# Patient Record
Sex: Female | Born: 1965 | Race: Black or African American | Hispanic: No | Marital: Single | Smoking: Current every day smoker
Health system: Southern US, Community
[De-identification: ages and names within clinical notes are randomized; demographics above are authoritative.]

## PROBLEM LIST (undated history)

## (undated) HISTORY — PX: TUBAL LIGATION: SHX77

---

## 2007-12-31 ENCOUNTER — Ambulatory Visit: Payer: Self-pay | Admitting: Obstetrics & Gynecology

## 2007-12-31 ENCOUNTER — Other Ambulatory Visit: Admission: RE | Admit: 2007-12-31 | Discharge: 2007-12-31 | Payer: Self-pay | Admitting: Obstetrics and Gynecology

## 2008-01-13 ENCOUNTER — Ambulatory Visit: Payer: Self-pay | Admitting: Obstetrics and Gynecology

## 2011-06-22 ENCOUNTER — Encounter (HOSPITAL_BASED_OUTPATIENT_CLINIC_OR_DEPARTMENT_OTHER): Payer: Self-pay | Admitting: *Deleted

## 2011-06-22 ENCOUNTER — Emergency Department (HOSPITAL_BASED_OUTPATIENT_CLINIC_OR_DEPARTMENT_OTHER)
Admission: EM | Admit: 2011-06-22 | Discharge: 2011-06-22 | Disposition: A | Discharge status: Home / Self Care | Payer: Self-pay | Attending: Emergency Medicine | Admitting: Emergency Medicine

## 2011-06-22 DIAGNOSIS — M5430 Sciatica, unspecified side: Secondary | ICD-10-CM | POA: Insufficient documentation

## 2011-06-22 DIAGNOSIS — M549 Dorsalgia, unspecified: Secondary | ICD-10-CM | POA: Insufficient documentation

## 2011-06-22 DIAGNOSIS — R209 Unspecified disturbances of skin sensation: Secondary | ICD-10-CM | POA: Insufficient documentation

## 2011-06-22 LAB — URINALYSIS, ROUTINE W REFLEX MICROSCOPIC
Specific Gravity, Urine: 1.006 (ref 1.005–1.030)
Urobilinogen, UA: 1 mg/dL (ref 0.0–1.0)
pH: 7 (ref 5.0–8.0)

## 2011-06-22 LAB — PREGNANCY, URINE: Preg Test, Ur: NEGATIVE

## 2011-06-22 LAB — URINE MICROSCOPIC-ADD ON

## 2011-06-22 MED ORDER — IBUPROFEN 600 MG PO TABS: 600.0000 mg | ORAL_TABLET | Freq: Four times a day (QID) | ORAL | Status: AC | PRN

## 2011-06-22 MED ORDER — OXYCODONE-ACETAMINOPHEN 5-325 MG PO TABS
1.0000 | ORAL_TABLET | Freq: Once | ORAL | Status: AC
  Administered 2011-06-22: 1 via ORAL
  Filled 2011-06-22: qty 1

## 2011-06-22 MED ORDER — OXYCODONE-ACETAMINOPHEN 7.5-325 MG PO TABS: 1.0000 | ORAL_TABLET | ORAL | Status: AC | PRN

## 2011-06-22 NOTE — ED Notes (Signed)
Pt states she has been hurting in her mid-lower back area since Wed. Tried Ibuprofen without relief. Denies other s/s.

## 2011-06-22 NOTE — ED Provider Notes (Signed)
History    Scribed for Nelia Shi, MD, the patient was seen in room [facility]1/[facility]. This chart was scribed by Katha Cabal.   CSN: [id number]  Arrival date & time 06/22/11  1654   First MD Initiated Contact with Patient 06/22/11 1854      Chief Complaint  Patient presents with  . Back Pain    (Consider location/radiation/quality/duration/timing/severity/associated sxs/prior treatment) Patient is a 46 y.o. female presenting with back pain. The history is provided by the patient. No language interpreter was used.  Back Pain  This is a new problem. Episode onset: about 4 days ago. The problem occurs constantly. The problem has been gradually worsening. The pain is associated with no known injury. The pain is present in the lumbar spine. The quality of the pain is described as shooting. The pain radiates to the right thigh. Pain severity now: moderate to severe  The symptoms are aggravated by certain positions (sitting ). Pertinent negatives include no bowel incontinence and no bladder incontinence. She has tried NSAIDs for the symptoms.    Patient reports shooting pain that radiates down her leg.  Pain worsened by certain position.  Patient taking 800 mg ibuprofen for pain.  Patient denies history of back or nerve problems.  Pain associated with sleep disturbance.    History reviewed. No pertinent past medical history.  Past Surgical History  Procedure Date  . Tubal ligation     History reviewed. No pertinent family history.  History  Substance Use Topics  . Smoking status: Current Everyday Smoker  . Smokeless tobacco: Not on file  . Alcohol Use: Yes    OB History    Grav Para Term Preterm Abortions TAB SAB Ect Mult Living                  Review of Systems  Gastrointestinal: Negative for bowel incontinence.  Genitourinary: Negative for bladder incontinence.  Musculoskeletal: Positive for back pain.  Remaining review of systems negative except as noted in the HPI.     Allergies  Review of patient's allergies indicates not on file.  Home Medications   Current Outpatient Rx  Name Route Sig Dispense Refill  . IBUPROFEN 600 MG PO TABS Oral Take 1 tablet (600 mg total) by mouth every 6 (six) hours as needed for pain. 30 tablet 0  . OXYCODONE-ACETAMINOPHEN 7.5-325 MG PO TABS Oral Take 1 tablet by mouth every 4 (four) hours as needed for pain. 30 tablet 0    BP 137/70  Pulse 92  Temp(Src) 97.6 F (36.4 C) (Oral)  Resp 18  Ht 5\' 5"  (1.651 m)  Wt 198 lb (89.812 kg)  BMI 32.95 kg/m2  SpO2 100%  LMP 06/22/2011  Physical Exam  Nursing note and vitals reviewed. Constitutional: She is oriented to person, place, and time. She appears well-developed and well-nourished. No distress.       Patient looks uncomfortable.  She sitting on the side of the gurney leaning towards the right because any movement causes back pain which radiates down her leg  HENT:  Head: Normocephalic and atraumatic.  Eyes: Pupils are equal, round, and reactive to light.  Neck: Normal range of motion.  Cardiovascular: Normal rate and intact distal pulses.   Pulmonary/Chest: No respiratory distress.  Abdominal: Normal appearance. She exhibits no distension.  Musculoskeletal: Normal range of motion. She exhibits tenderness.       exquisite tenderness to palpation to L1, Pain in right lumbar radiating to right buttock  Neurological: She is  alert and oriented to person, place, and time. No cranial nerve deficit.  Skin: Skin is warm and dry. No rash noted.  Psychiatric: She has a normal mood and affect. Her behavior is normal.    ED Course  Procedures (including critical care time)   DIAGNOSTIC STUDIES: Oxygen Saturation is 100% on room air, normal by my interpretation.     COORDINATION OF CARE: 7:03 PM  Physical exam complete.     LABS / RADIOLOGY:   Labs Reviewed  URINALYSIS, ROUTINE W REFLEX MICROSCOPIC - Abnormal; Notable for the following:    Hgb urine dipstick  MODERATE (*)    All other components within normal limits  PREGNANCY, URINE  URINE MICROSCOPIC-ADD ON   No results found.       MDM  I personally performed the services described in this documentation, which was scribed in my presence. The recorded information has been reviewed and considered.       IMPRESSION: 1. Sciatica       DISCHARGE MEDICATIONS: New Prescriptions   IBUPROFEN (ADVIL,MOTRIN) 600 MG TABLET    Take 1 tablet (600 mg total) by mouth every 6 (six) hours as needed for pain.   OXYCODONE-ACETAMINOPHEN (PERCOCET) 7.5-325 MG PER TABLET    Take 1 tablet by mouth every 4 (four) hours as needed for pain.    Plan:  Patient will be given pain medication with directions.  If she's not improved by Tuesday she could return for an MRI scan        Nelia Shi, MD 06/22/11 1911

## 2014-05-20 ENCOUNTER — Encounter (HOSPITAL_BASED_OUTPATIENT_CLINIC_OR_DEPARTMENT_OTHER): Payer: Self-pay

## 2014-05-20 ENCOUNTER — Emergency Department (HOSPITAL_BASED_OUTPATIENT_CLINIC_OR_DEPARTMENT_OTHER): Payer: No Typology Code available for payment source

## 2014-05-20 ENCOUNTER — Emergency Department (HOSPITAL_BASED_OUTPATIENT_CLINIC_OR_DEPARTMENT_OTHER)
Admission: EM | Admit: 2014-05-20 | Discharge: 2014-05-20 | Disposition: A | Discharge status: Home / Self Care | Payer: No Typology Code available for payment source | Attending: Emergency Medicine | Admitting: Emergency Medicine

## 2014-05-20 DIAGNOSIS — M546 Pain in thoracic spine: Secondary | ICD-10-CM | POA: Insufficient documentation

## 2014-05-20 DIAGNOSIS — Z720 Tobacco use: Secondary | ICD-10-CM | POA: Diagnosis not present

## 2014-05-20 DIAGNOSIS — M549 Dorsalgia, unspecified: Secondary | ICD-10-CM

## 2014-05-20 MED ORDER — HYDROCODONE-ACETAMINOPHEN 5-325 MG PO TABS: 2.0000 | ORAL_TABLET | ORAL | Status: AC | PRN

## 2014-05-20 MED ORDER — CYCLOBENZAPRINE HCL 10 MG PO TABS: 10.0000 mg | ORAL_TABLET | Freq: Three times a day (TID) | ORAL | Status: AC | PRN

## 2014-05-20 NOTE — ED Provider Notes (Signed)
CSN: [id number]     Arrival date & time 05/20/14  1624 History   First MD Initiated Contact with Patient 05/20/14 1720     Chief Complaint  Patient presents with  . Back Pain     (Consider location/radiation/quality/duration/timing/severity/associated sxs/prior Treatment) HPI Comments: Patient presents to the ER for evaluation of back pain. Patient reports that she has been experiencing pain in the upper portion of the right side of her back for approximately a week. She denies any injury. Patient reports that she was at the [facility] standing in line today when the pain intensified. She has not noticed any cough or shortness of breath. She denies any injury. She has noticed that the pain significantly worsens if she bends or twists.  Patient is a 49 y.o. female presenting with back pain.  Back Pain   History reviewed. No pertinent past medical history. Past Surgical History  Procedure Laterality Date  . Tubal ligation     No family history on file. History  Substance Use Topics  . Smoking status: Current Every Day Smoker  . Smokeless tobacco: Not on file  . Alcohol Use: Yes   OB History    No data available     Review of Systems  Musculoskeletal: Positive for back pain.  All other systems reviewed and are negative.     Allergies  Review of patient's allergies indicates no known allergies.  Home Medications   Prior to Admission medications   Medication Sig Start Date End Date Taking? Authorizing Provider  loratadine (CLARITIN) 10 MG tablet Take 10 mg by mouth daily.   Yes Historical Provider, MD  montelukast (SINGULAIR) 10 MG tablet Take 10 mg by mouth at bedtime.   Yes Historical Provider, MD   BP 132/73 mmHg  Pulse 67  Temp(Src) 97.7 F (36.5 C)  Resp 16  SpO2 100%  LMP  (Approximate) Physical Exam  Constitutional: She is oriented to person, place, and time. She appears well-developed and well-nourished. No distress.  HENT:  Head: Normocephalic and atraumatic.    Right Ear: Hearing normal.  Left Ear: Hearing normal.  Nose: Nose normal.  Mouth/Throat: Oropharynx is clear and moist and mucous membranes are normal.  Eyes: Conjunctivae and EOM are normal. Pupils are equal, round, and reactive to light.  Neck: Normal range of motion. Neck supple.  Cardiovascular: Regular rhythm, S1 normal and S2 normal.  Exam reveals no gallop and no friction rub.   No murmur heard. Pulmonary/Chest: Effort normal and breath sounds normal. No respiratory distress. She exhibits no tenderness.  Abdominal: Soft. Normal appearance and bowel sounds are normal. There is no hepatosplenomegaly. There is no tenderness. There is no rebound, no guarding, no tenderness at McBurney's point and negative Murphy's sign. No hernia.  Musculoskeletal: Normal range of motion.       Thoracic back: She exhibits tenderness.       Back:  Neurological: She is alert and oriented to person, place, and time. She has normal strength. No cranial nerve deficit or sensory deficit. Coordination normal. GCS eye subscore is 4. GCS verbal subscore is 5. GCS motor subscore is 6.  Skin: Skin is warm, dry and intact. No rash noted. No cyanosis.  Psychiatric: She has a normal mood and affect. Her speech is normal and behavior is normal. Thought content normal.  Nursing note and vitals reviewed.   ED Course  Procedures (including critical care time) Labs Review Labs Reviewed - No data to display  Imaging Review Dg Thoracic Spine 2  View  05/20/2014   CLINICAL DATA:  Mild back pain for 2 weeks.  Initially counter  EXAM: THORACIC SPINE - 2 VIEW  COMPARISON:  None.  FINDINGS: Normal alignment of the thoracic vertebral bodies. There is multiple levels of end plate spurring and endplate sclerosis. No acute loss vertebral body height or disc height.  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  No acute findings.   Electronically Signed   By: Genevive BiStewart  Edmunds M.D.   On: 05/20/2014 18:13     EKG  Interpretation None      MDM   Final diagnoses:  Back pain    Resents to the ER for evaluation of mid to upper thoracic back pain. Pain is to the right side, paravertebral region. There was no injury. X-ray is negative. Patient does not have any pulmonary symptoms. Symptoms are very reproducible with movement and palpation. This is consistent with musculoskeletal pain. Patient will require analgesia and rest.    Gilda Creasehristopher J. Pollina, MD 05/20/14 [phone]

## 2014-05-20 NOTE — ED Notes (Signed)
Reports right upper back pain x 1 week. Denies injury.

## 2014-05-20 NOTE — Discharge Instructions (Signed)
Back Pain, Adult Low back pain is very common. About 1 in 5 people have back pain.The cause of low back pain is rarely dangerous. The pain often gets better over time.About half of people with a sudden onset of back pain feel better in just 2 weeks. About 8 in 10 people feel better by 6 weeks.  CAUSES Some common causes of back pain include:  Strain of the muscles or ligaments supporting the spine.  Wear and tear (degeneration) of the spinal discs.  Arthritis.  Direct injury to the back. DIAGNOSIS Most of the time, the direct cause of low back pain is not known.However, back pain can be treated effectively even when the exact cause of the pain is unknown.Answering your caregiver's questions about your overall health and symptoms is one of the most accurate ways to make sure the cause of your pain is not dangerous. If your caregiver needs more information, he or she may order lab work or imaging tests (X-rays or MRIs).However, even if imaging tests show changes in your back, this usually does not require surgery. HOME CARE INSTRUCTIONS For many people, back pain returns.Since low back pain is rarely dangerous, it is often a condition that people can learn to manageon their own.   Remain active. It is stressful on the back to sit or stand in one place. Do not sit, drive, or stand in one place for more than 30 minutes at a time. Take short walks on level surfaces as soon as pain allows.Try to increase the length of time you walk each day.  Do not stay in bed.Resting more than 1 or 2 days can delay your recovery.  Do not avoid exercise or work.Your body is made to move.It is not dangerous to be active, even though your back may hurt.Your back will likely heal faster if you return to being active before your pain is gone.  Pay attention to your body when you bend and lift. Many people have less discomfortwhen lifting if they bend their knees, keep the load close to their bodies,and  avoid twisting. Often, the most comfortable positions are those that put less stress on your recovering back.  Find a comfortable position to sleep. Use a firm mattress and lie on your side with your knees slightly bent. If you lie on your back, put a pillow under your knees.  Only take over-the-counter or prescription medicines as directed by your caregiver. Over-the-counter medicines to reduce pain and inflammation are often the most helpful.Your caregiver may prescribe muscle relaxant drugs.These medicines help dull your pain so you can more quickly return to your normal activities and healthy exercise.  Put ice on the injured area.  Put ice in a plastic bag.  Place a towel between your skin and the bag.  Leave the ice on for 15-20 minutes, 03-04 times a day for the first 2 to 3 days. After that, ice and heat may be alternated to reduce pain and spasms.  Ask your caregiver about trying back exercises and gentle massage. This may be of some benefit.  Avoid feeling anxious or stressed.Stress increases muscle tension and can worsen back pain.It is important to recognize when you are anxious or stressed and learn ways to manage it.Exercise is a great option. SEEK MEDICAL CARE IF:  You have pain that is not relieved with rest or medicine.  You have pain that does not improve in 1 week.  You have new symptoms.  You are generally not feeling well. SEEK   IMMEDIATE MEDICAL CARE IF:   You have pain that radiates from your back into your legs.  You develop new bowel or bladder control problems.  You have unusual weakness or numbness in your arms or legs.  You develop nausea or vomiting.  You develop abdominal pain.  You feel faint. Document Released: 04/29/2005 Document Revised: 10/29/2011 Document Reviewed: 08/31/2013 Exit[employer] Patient Information 2015 [employer]. This information is not intended to replace advice given to you by your health care provider. Make sure you  discuss any questions you have with your health care provider.  

## 2016-02-13 ENCOUNTER — Ambulatory Visit: Payer: No Typology Code available for payment source | Admitting: Podiatry

## 2016-02-20 ENCOUNTER — Ambulatory Visit (HOSPITAL_BASED_OUTPATIENT_CLINIC_OR_DEPARTMENT_OTHER)
Admission: RE | Admit: 2016-02-20 | Discharge: 2016-02-20 | Disposition: A | Discharge status: Home / Self Care | Payer: No Typology Code available for payment source | Source: Ambulatory Visit | Attending: Podiatry | Admitting: Podiatry

## 2016-02-20 ENCOUNTER — Ambulatory Visit (INDEPENDENT_AMBULATORY_CARE_PROVIDER_SITE_OTHER): Payer: No Typology Code available for payment source | Admitting: Podiatry

## 2016-02-20 ENCOUNTER — Encounter: Payer: Self-pay | Admitting: Podiatry

## 2016-02-20 DIAGNOSIS — M25571 Pain in right ankle and joints of right foot: Secondary | ICD-10-CM | POA: Diagnosis present

## 2016-02-20 DIAGNOSIS — M21619 Bunion of unspecified foot: Secondary | ICD-10-CM | POA: Insufficient documentation

## 2016-02-20 DIAGNOSIS — R52 Pain, unspecified: Secondary | ICD-10-CM

## 2016-02-20 DIAGNOSIS — M2012 Hallux valgus (acquired), left foot: Secondary | ICD-10-CM | POA: Insufficient documentation

## 2016-02-20 DIAGNOSIS — M19072 Primary osteoarthritis, left ankle and foot: Secondary | ICD-10-CM | POA: Diagnosis not present

## 2016-02-20 DIAGNOSIS — M19071 Primary osteoarthritis, right ankle and foot: Secondary | ICD-10-CM | POA: Diagnosis not present

## 2016-02-20 DIAGNOSIS — M2011 Hallux valgus (acquired), right foot: Secondary | ICD-10-CM | POA: Insufficient documentation

## 2016-02-20 DIAGNOSIS — M25572 Pain in left ankle and joints of left foot: Secondary | ICD-10-CM | POA: Diagnosis present

## 2016-02-20 NOTE — Patient Instructions (Signed)

## 2016-02-20 NOTE — Progress Notes (Signed)
   Subjective:    Patient ID: Erika Boyd, female    DOB: [birth year], 50 y.o.   MRN: [id number]  HPI  50 year old female presents the office today for concerns of bilateral bunion deformity the right side worse in the left which is been ongoing for quite some time it has been worsening recently. She has tried changing her shoes as well as using offloading pads which helps some but she still gets pain on the bunion site. She states that her specific issues with pressure and area gets rid times mutation. She denies any recent injury or trauma. She has no other complaints stem.   Review of Systems  All other systems reviewed and are negative.      Objective:   Physical Exam General: AAO x3, NAD  Dermatological: Mild erythema on the medial aspect of the first metatarsal heads bilaterally upon the site of the bunion. There is no fluctuance or crepitus noted malodor. No signs of infection. Nails x 10 are well manicured; remaining integument appears unremarkable at this time. There are no open sores, no preulcerative lesions, no rash or signs of infection present.  Vascular: Dorsalis Pedis artery and Posterior Tibial artery pedal pulses are 2/4 bilateral with immedate capillary fill time. There is no pain with calf compression, swelling, warmth, erythema.   Neruologic: Grossly intact via light touch bilateral. Vibratory intact via tuning fork bilateral. Protective threshold with Semmes Wienstein monofilament intact to all pedal sites bilateral.   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 There is tenderness palpation along the medial aspect of the first metatarsal head on the bunion sites. There is no pain or crepitation first MPJ range of motion. No hypermobility is present. Decrease in medial arch height upon weightbearing. Equinus present. MMT 5/5. Range of motion intact.  Gait: Unassisted, Nonantalgic.     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 Moderate HAV is present.  -I discussed both conservative and surgical treatment options. This time she is attended to conservative treatment including shoe gear changes, offloading pads or any significant relief of symptoms. This time she is going to discuss surgical intervention. Discussed with her metatarsal osteotomy with screw fixation. Discussed with the surgery as well as postoperative course as well as risks and complications. She'll likely undergo surgical intervention in the future. She was given contact remission for surgery scheduler to call the office if she wishes to proceed with surgery now see her back for surgical consent. In the meantime continue to changes, offloading pads.   Ovid CurdMatthew Phill Steck, DPM

## 2017-06-24 IMAGING — DX DG FOOT COMPLETE 3+V*R*
3 series · 3 of 3 positions shown · non-contrast
Comparison: None.

CLINICAL DATA: Foot pain bilaterally

EXAM:
RIGHT FOOT COMPLETE - 3+ VIEW; LEFT FOOT - COMPLETE 3+ VIEW

[foot ap]
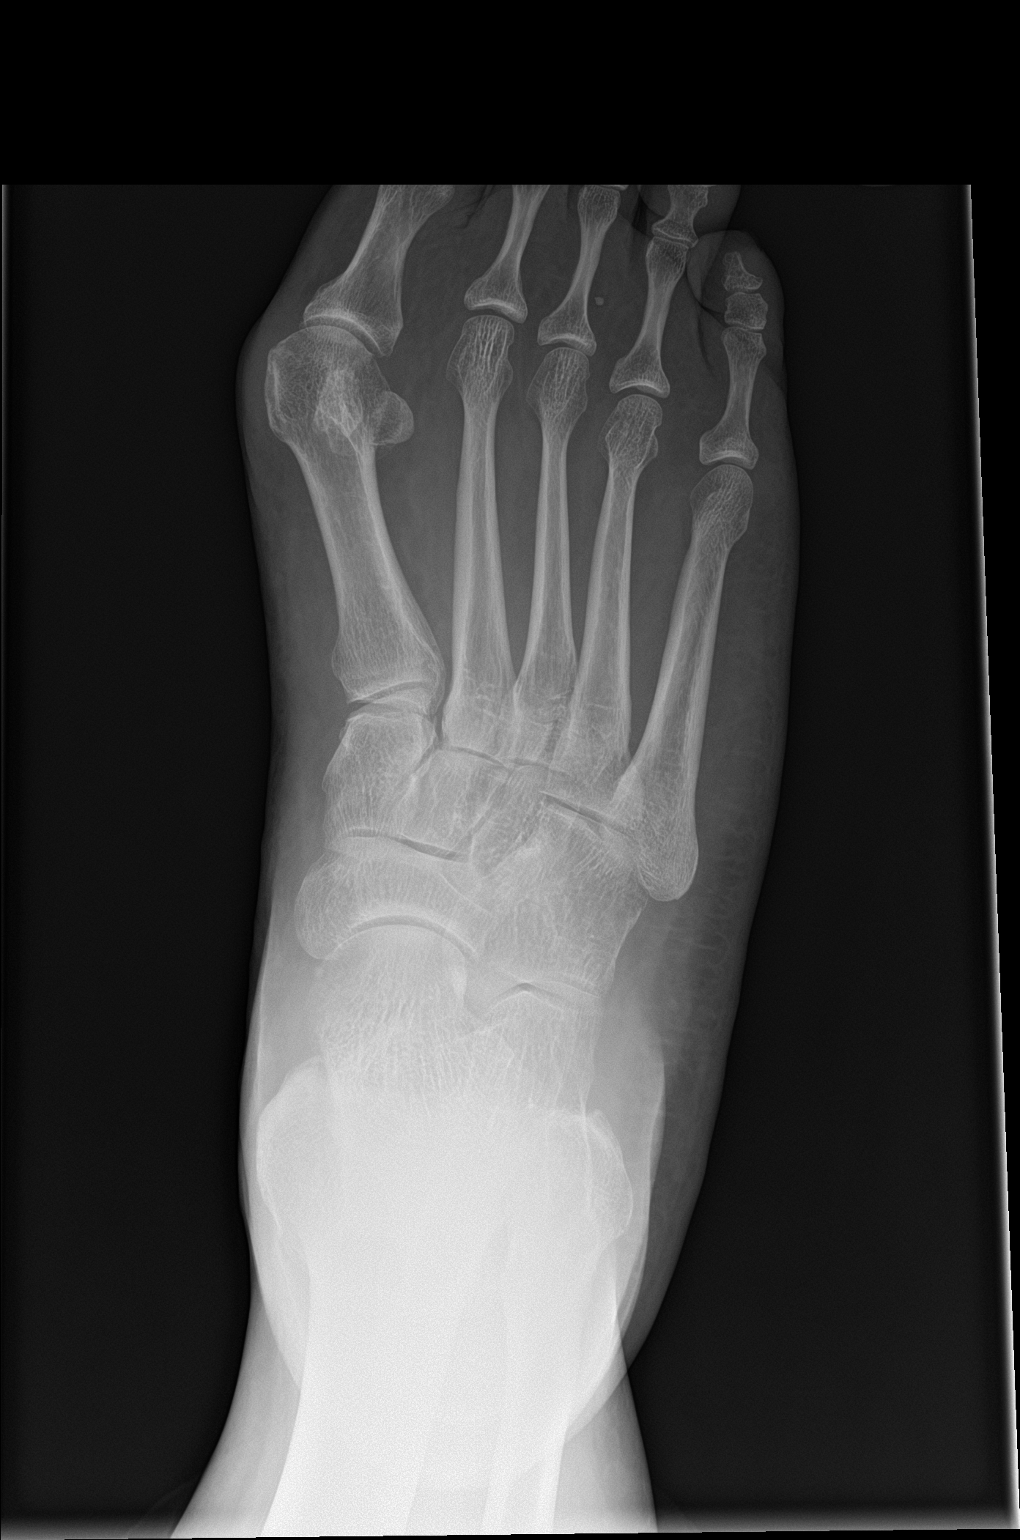

[foot obl]
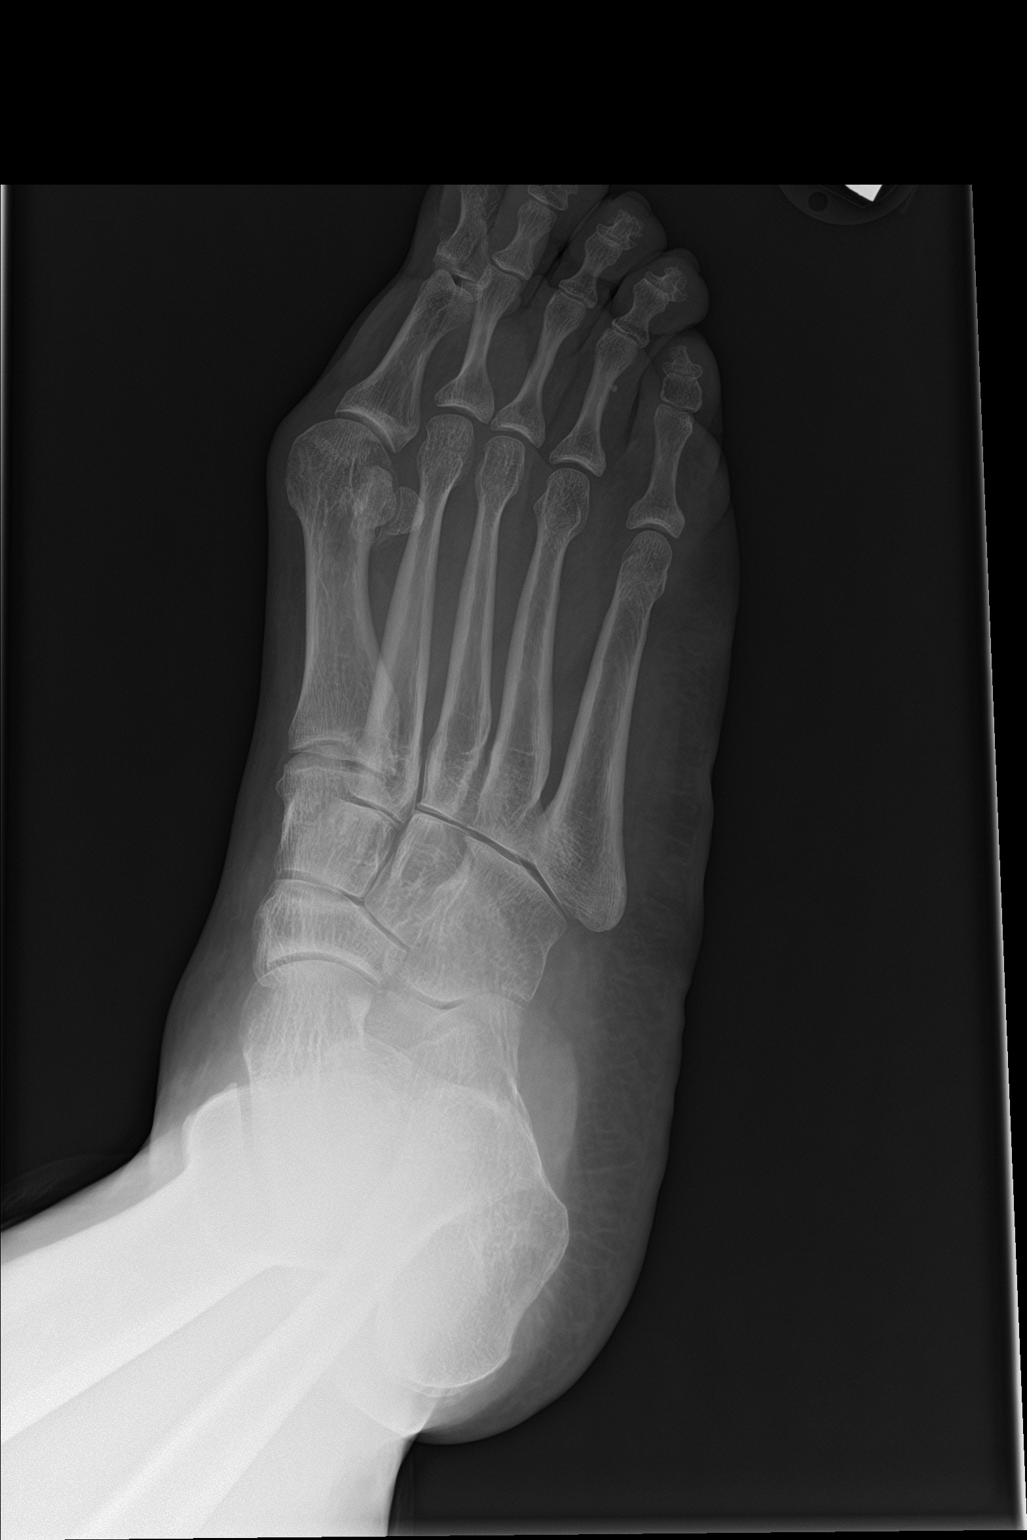

[foot lat]
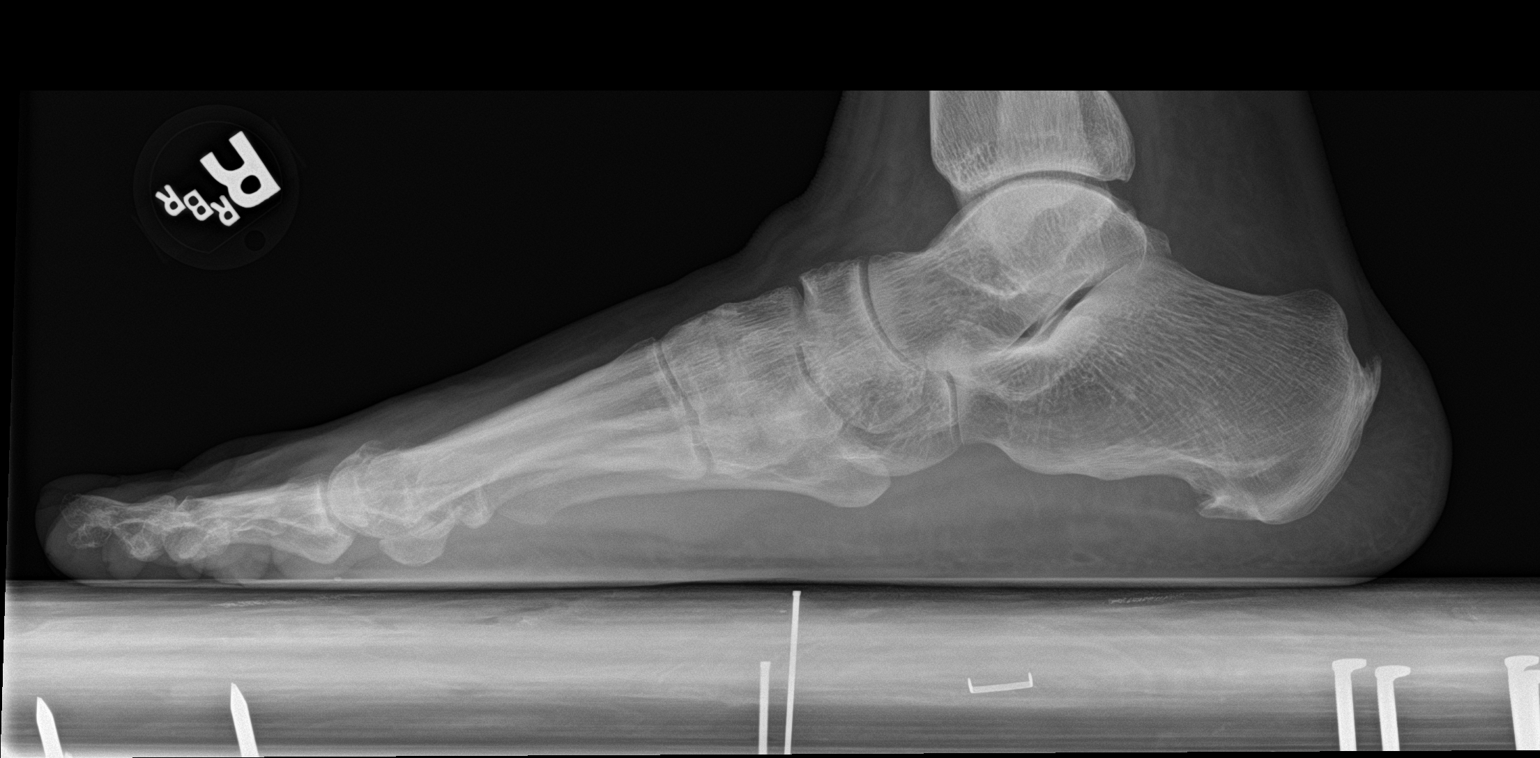

[3 of 3 positions shown; findings below may reference images not displayed]

FINDINGS: Bones: No fracture or dislocation. Normal bone mineralization.
Bilateral plantar calcaneal spurs. Relative pes planus bilaterally.

Joints: Normal alignment. No erosive changes. Hallux valgus of
bilateral feet with mild osteoarthritis of the first MTP joints.
Remainder the joint spaces are maintained.

Soft tissue: No soft tissue abnormality. No radiopaque foreign body.
No subcutaneous emphysema.
IMPRESSION: Hallux valgus of bilateral feet with mild osteoarthritis of the
first MTP joints.
